# Patient Record
Sex: Female | Born: 1955 | Race: White | Hispanic: No | Marital: Single
Health system: Southern US, Community
[De-identification: ages and names within clinical notes are randomized; demographics above are authoritative.]

---

## 2005-05-09 ENCOUNTER — Ambulatory Visit: Payer: Self-pay | Admitting: *Deleted

## 2006-11-28 ENCOUNTER — Emergency Department: Payer: Self-pay | Admitting: Unknown Physician Specialty

## 2007-08-13 ENCOUNTER — Ambulatory Visit: Payer: Self-pay | Admitting: Family Medicine

## 2009-11-15 ENCOUNTER — Ambulatory Visit: Payer: Self-pay | Admitting: Family Medicine

## 2010-05-02 ENCOUNTER — Ambulatory Visit: Payer: Self-pay | Admitting: Gastroenterology

## 2011-02-21 ENCOUNTER — Ambulatory Visit: Payer: Self-pay | Admitting: Family Medicine

## 2012-05-13 ENCOUNTER — Ambulatory Visit: Payer: Self-pay | Admitting: Family Medicine

## 2012-06-29 ENCOUNTER — Ambulatory Visit: Payer: Self-pay | Admitting: Family Medicine

## 2012-06-29 LAB — URINALYSIS, COMPLETE
Bilirubin,UR: NEGATIVE
Glucose,UR: NEGATIVE mg/dL (ref 0–75)
Ph: 6 (ref 4.5–8.0)

## 2012-07-01 LAB — URINE CULTURE

## 2013-06-29 ENCOUNTER — Ambulatory Visit: Payer: Self-pay | Admitting: Nurse Practitioner

## 2019-04-01 ENCOUNTER — Other Ambulatory Visit: Payer: Self-pay | Admitting: Family Medicine

## 2019-04-01 DIAGNOSIS — Z1231 Encounter for screening mammogram for malignant neoplasm of breast: Secondary | ICD-10-CM

## 2019-06-22 ENCOUNTER — Ambulatory Visit
Admission: RE | Admit: 2019-06-22 | Discharge: 2019-06-22 | Disposition: A | Discharge status: Home / Self Care | Payer: 59 | Source: Ambulatory Visit | Attending: Family Medicine | Admitting: Family Medicine

## 2019-06-22 ENCOUNTER — Other Ambulatory Visit: Payer: Self-pay

## 2019-06-22 DIAGNOSIS — Z1231 Encounter for screening mammogram for malignant neoplasm of breast: Secondary | ICD-10-CM | POA: Diagnosis not present

## 2020-08-08 ENCOUNTER — Encounter: Payer: Self-pay | Admitting: *Deleted

## 2020-08-10 ENCOUNTER — Other Ambulatory Visit: Payer: Self-pay | Admitting: Family Medicine

## 2020-08-10 DIAGNOSIS — Z1231 Encounter for screening mammogram for malignant neoplasm of breast: Secondary | ICD-10-CM

## 2020-08-29 ENCOUNTER — Other Ambulatory Visit: Payer: Self-pay

## 2020-08-29 ENCOUNTER — Ambulatory Visit
Admission: RE | Admit: 2020-08-29 | Discharge: 2020-08-29 | Disposition: A | Discharge status: Home / Self Care | Payer: PRIVATE HEALTH INSURANCE | Source: Ambulatory Visit | Attending: Family Medicine | Admitting: Family Medicine

## 2020-08-29 DIAGNOSIS — Z1231 Encounter for screening mammogram for malignant neoplasm of breast: Secondary | ICD-10-CM | POA: Insufficient documentation

## 2021-03-27 DIAGNOSIS — H35342 Macular cyst, hole, or pseudohole, left eye: Secondary | ICD-10-CM | POA: Diagnosis not present

## 2021-04-28 DIAGNOSIS — H43811 Vitreous degeneration, right eye: Secondary | ICD-10-CM | POA: Diagnosis not present

## 2021-04-28 DIAGNOSIS — H2513 Age-related nuclear cataract, bilateral: Secondary | ICD-10-CM | POA: Diagnosis not present

## 2021-04-28 DIAGNOSIS — H35342 Macular cyst, hole, or pseudohole, left eye: Secondary | ICD-10-CM | POA: Diagnosis not present

## 2021-05-18 DIAGNOSIS — H35342 Macular cyst, hole, or pseudohole, left eye: Secondary | ICD-10-CM | POA: Diagnosis not present

## 2021-05-19 DIAGNOSIS — H35342 Macular cyst, hole, or pseudohole, left eye: Secondary | ICD-10-CM | POA: Diagnosis not present

## 2021-05-26 DIAGNOSIS — H35342 Macular cyst, hole, or pseudohole, left eye: Secondary | ICD-10-CM | POA: Diagnosis not present

## 2021-06-16 DIAGNOSIS — H43811 Vitreous degeneration, right eye: Secondary | ICD-10-CM | POA: Diagnosis not present

## 2021-06-16 DIAGNOSIS — H35342 Macular cyst, hole, or pseudohole, left eye: Secondary | ICD-10-CM | POA: Diagnosis not present

## 2021-06-16 DIAGNOSIS — H35371 Puckering of macula, right eye: Secondary | ICD-10-CM | POA: Diagnosis not present

## 2021-10-10 ENCOUNTER — Other Ambulatory Visit: Payer: Self-pay | Admitting: Family Medicine

## 2021-10-10 DIAGNOSIS — Z1231 Encounter for screening mammogram for malignant neoplasm of breast: Secondary | ICD-10-CM

## 2021-12-06 ENCOUNTER — Ambulatory Visit
Admission: RE | Admit: 2021-12-06 | Discharge: 2021-12-06 | Disposition: A | Discharge status: Home / Self Care | Payer: PPO | Source: Ambulatory Visit | Attending: Family Medicine | Admitting: Family Medicine

## 2021-12-06 ENCOUNTER — Other Ambulatory Visit: Payer: Self-pay

## 2021-12-06 DIAGNOSIS — Z1231 Encounter for screening mammogram for malignant neoplasm of breast: Secondary | ICD-10-CM | POA: Diagnosis not present

## 2022-08-21 ENCOUNTER — Ambulatory Visit (LOCAL_COMMUNITY_HEALTH_CENTER): Payer: Self-pay

## 2022-08-21 DIAGNOSIS — Z111 Encounter for screening for respiratory tuberculosis: Secondary | ICD-10-CM

## 2022-08-21 NOTE — Progress Notes (Signed)
Here for ppd. Pt explains plans to have PPDR at employer [facility]) and not at [place]. Copy of PPD placement info given to pt. ROI signed. Josie Saunders, RN

## 2022-08-24 ENCOUNTER — Ambulatory Visit (LOCAL_COMMUNITY_HEALTH_CENTER): Payer: Self-pay

## 2022-08-24 DIAGNOSIS — Z111 Encounter for screening for respiratory tuberculosis: Secondary | ICD-10-CM

## 2022-08-24 LAB — TB SKIN TEST
Induration: 0 mm
TB Skin Test: NEGATIVE

## 2022-12-24 DIAGNOSIS — E039 Hypothyroidism, unspecified: Secondary | ICD-10-CM | POA: Diagnosis not present

## 2022-12-24 DIAGNOSIS — Z0000 Encounter for general adult medical examination without abnormal findings: Secondary | ICD-10-CM | POA: Diagnosis not present

## 2022-12-24 DIAGNOSIS — I10 Essential (primary) hypertension: Secondary | ICD-10-CM | POA: Diagnosis not present

## 2023-01-01 DIAGNOSIS — Z1283 Encounter for screening for malignant neoplasm of skin: Secondary | ICD-10-CM | POA: Diagnosis not present

## 2023-01-01 DIAGNOSIS — Z1211 Encounter for screening for malignant neoplasm of colon: Secondary | ICD-10-CM | POA: Diagnosis not present

## 2023-01-01 DIAGNOSIS — E039 Hypothyroidism, unspecified: Secondary | ICD-10-CM | POA: Diagnosis not present

## 2023-01-01 DIAGNOSIS — Z0000 Encounter for general adult medical examination without abnormal findings: Secondary | ICD-10-CM | POA: Diagnosis not present

## 2023-01-01 DIAGNOSIS — Z23 Encounter for immunization: Secondary | ICD-10-CM | POA: Diagnosis not present

## 2023-01-01 DIAGNOSIS — Z1231 Encounter for screening mammogram for malignant neoplasm of breast: Secondary | ICD-10-CM | POA: Diagnosis not present

## 2023-01-01 DIAGNOSIS — I10 Essential (primary) hypertension: Secondary | ICD-10-CM | POA: Diagnosis not present

## 2023-02-11 DIAGNOSIS — H2512 Age-related nuclear cataract, left eye: Secondary | ICD-10-CM | POA: Diagnosis not present

## 2023-02-11 DIAGNOSIS — Z961 Presence of intraocular lens: Secondary | ICD-10-CM | POA: Diagnosis not present

## 2023-02-12 DIAGNOSIS — H2511 Age-related nuclear cataract, right eye: Secondary | ICD-10-CM | POA: Diagnosis not present

## 2023-03-04 DIAGNOSIS — Z961 Presence of intraocular lens: Secondary | ICD-10-CM | POA: Diagnosis not present

## 2023-03-04 DIAGNOSIS — H2511 Age-related nuclear cataract, right eye: Secondary | ICD-10-CM | POA: Diagnosis not present

## 2023-05-14 DIAGNOSIS — L821 Other seborrheic keratosis: Secondary | ICD-10-CM | POA: Diagnosis not present

## 2023-05-14 DIAGNOSIS — D225 Melanocytic nevi of trunk: Secondary | ICD-10-CM | POA: Diagnosis not present

## 2023-05-28 ENCOUNTER — Ambulatory Visit: Payer: PPO

## 2023-05-28 DIAGNOSIS — Z83719 Family history of colon polyps, unspecified: Secondary | ICD-10-CM | POA: Diagnosis not present

## 2023-05-28 DIAGNOSIS — K5730 Diverticulosis of large intestine without perforation or abscess without bleeding: Secondary | ICD-10-CM | POA: Diagnosis not present

## 2023-05-28 DIAGNOSIS — K640 First degree hemorrhoids: Secondary | ICD-10-CM | POA: Diagnosis not present

## 2023-05-28 DIAGNOSIS — Z1211 Encounter for screening for malignant neoplasm of colon: Secondary | ICD-10-CM | POA: Diagnosis not present

## 2023-08-24 IMAGING — MG MM DIGITAL SCREENING BILAT W/ TOMO AND CAD
6 of 10 series · 6 of 30 positions shown · non-contrast
Comparison: Previous exam(s).

CLINICAL DATA: Screening.

EXAM:
DIGITAL SCREENING BILATERAL MAMMOGRAM WITH TOMOSYNTHESIS AND CAD
TECHNIQUE: Bilateral screening digital craniocaudal and mediolateral oblique
mammograms were obtained. Bilateral screening digital breast
tomosynthesis was performed. The images were evaluated with
computer-aided detection.

[L MLO synth-2D]
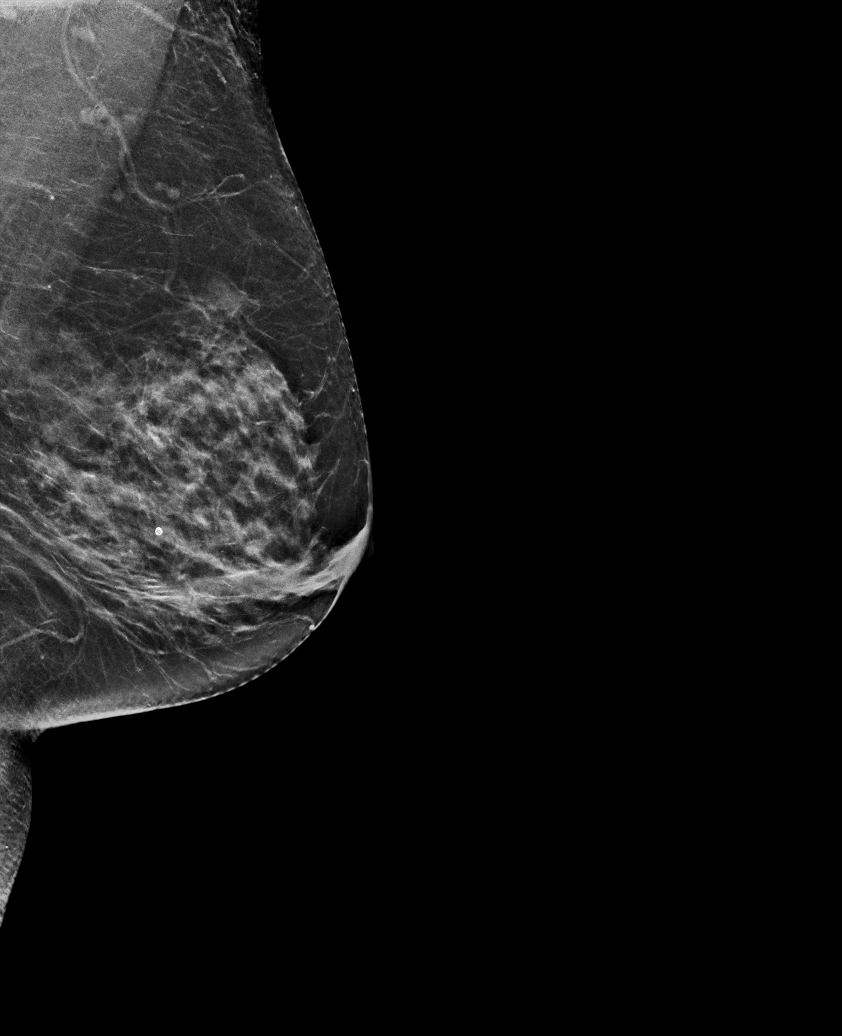

[R CC synth-2D (1 of 2)]
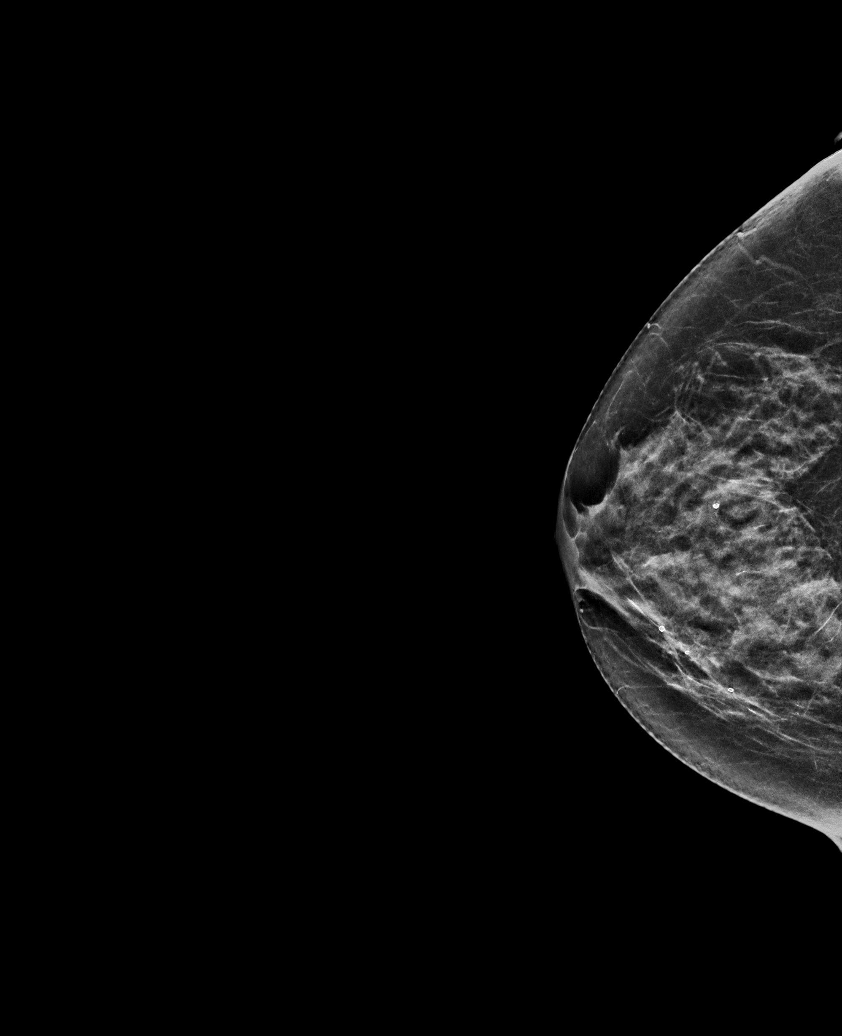

[R MLO synth-2D]
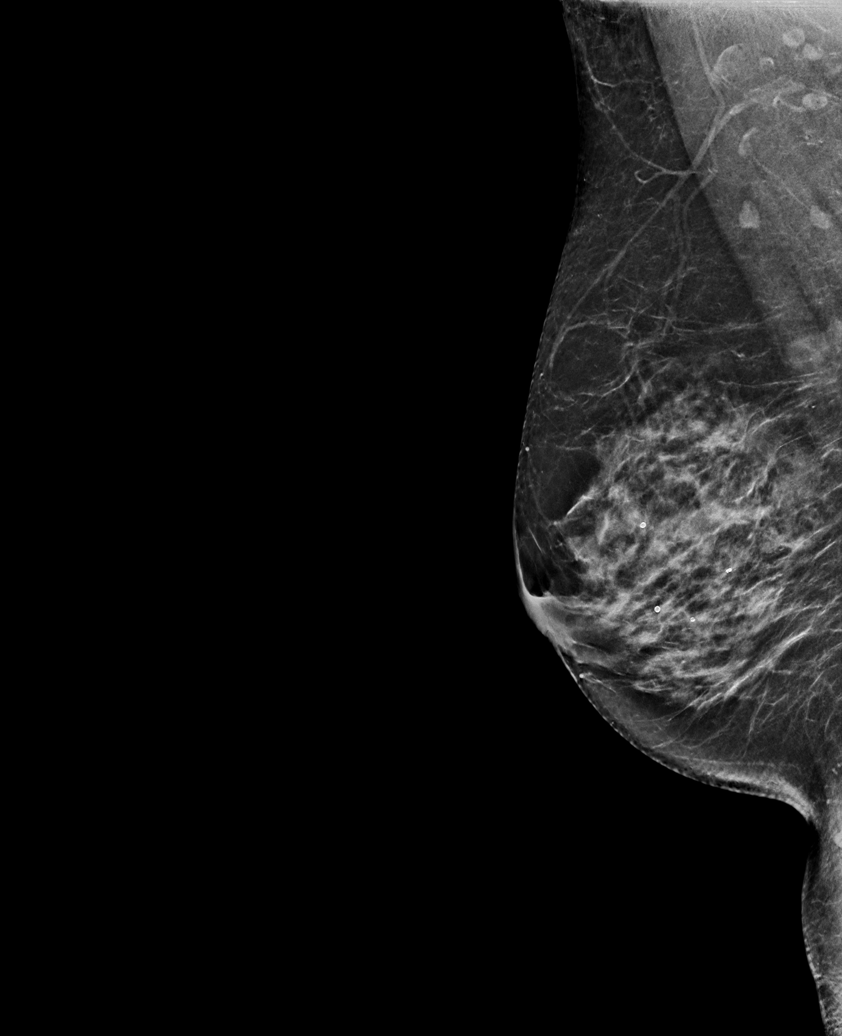

[R CC synth-2D (2 of 2)]
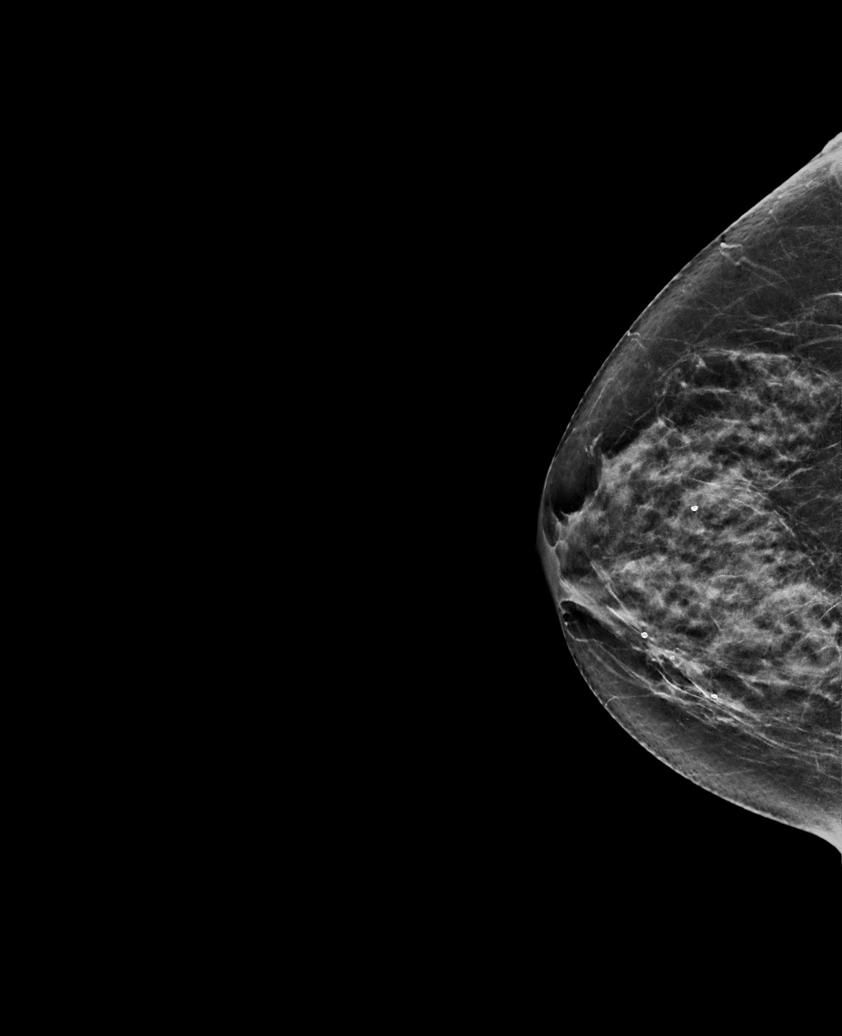

[L CC synth-2D]
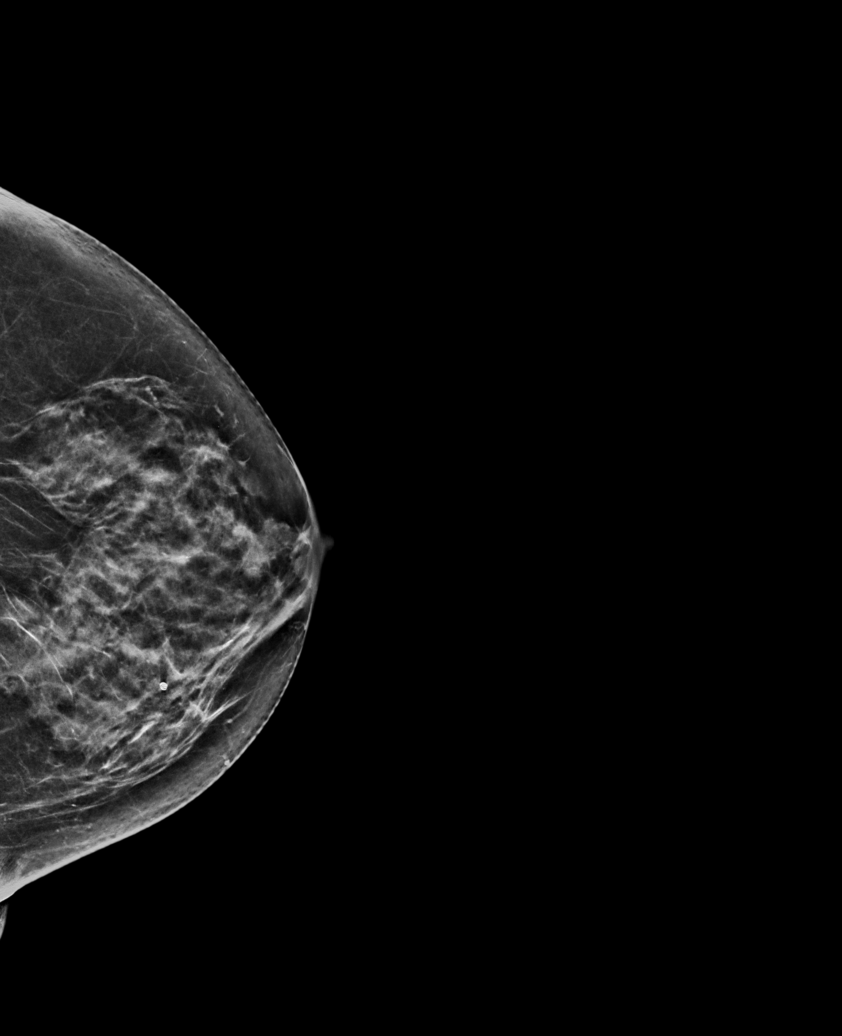

[L CC tomo · tomo slice 31/60.0]
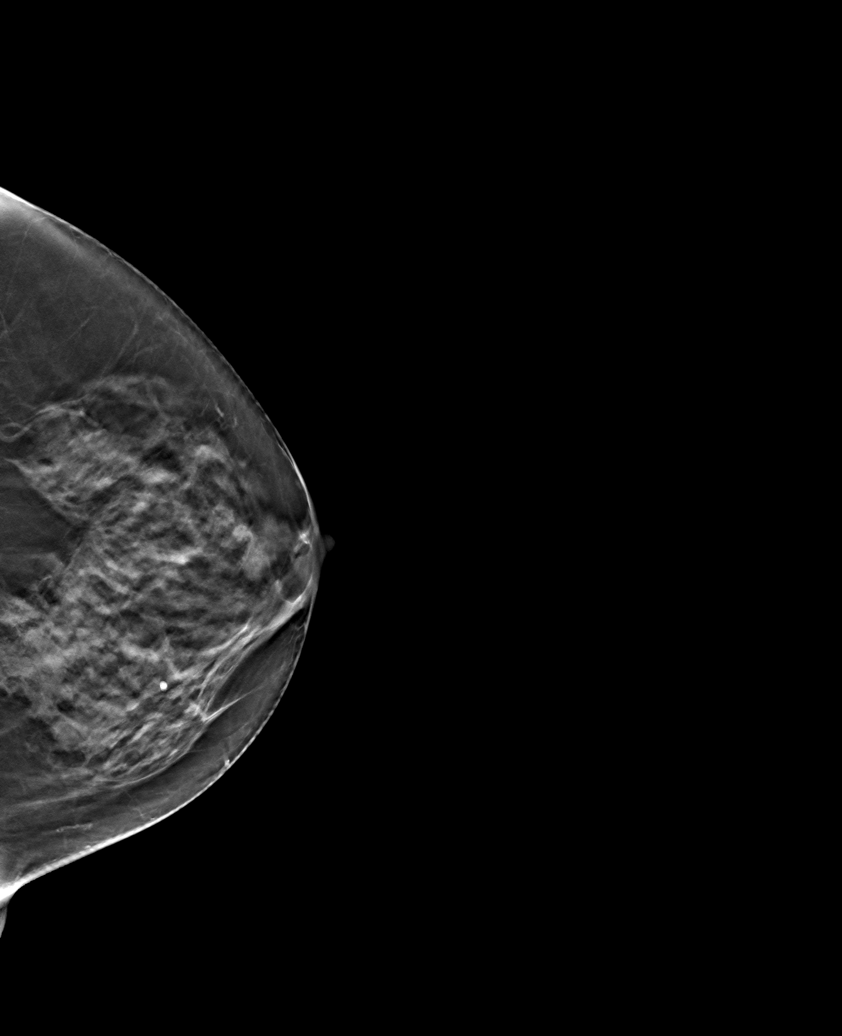

[6 of 30 positions shown; findings below may reference images not displayed]

ACR Breast Density Category c: The breast tissue is heterogeneously
dense, which may obscure small masses.
FINDINGS: There are no findings suspicious for malignancy.
IMPRESSION: No mammographic evidence of malignancy. A result letter of this
screening mammogram will be mailed directly to the patient.

RECOMMENDATION:
Screening mammogram in one year. (Code:Q3-W-BC3)

BI-RADS CATEGORY  1: Negative.

## 2024-01-13 DIAGNOSIS — Z1231 Encounter for screening mammogram for malignant neoplasm of breast: Secondary | ICD-10-CM | POA: Diagnosis not present

## 2024-01-13 DIAGNOSIS — E039 Hypothyroidism, unspecified: Secondary | ICD-10-CM | POA: Diagnosis not present

## 2024-01-13 DIAGNOSIS — I10 Essential (primary) hypertension: Secondary | ICD-10-CM | POA: Diagnosis not present

## 2024-01-13 DIAGNOSIS — Z0000 Encounter for general adult medical examination without abnormal findings: Secondary | ICD-10-CM | POA: Diagnosis not present

## 2024-01-13 DIAGNOSIS — R5383 Other fatigue: Secondary | ICD-10-CM | POA: Diagnosis not present

## 2024-01-13 DIAGNOSIS — E611 Iron deficiency: Secondary | ICD-10-CM | POA: Diagnosis not present

## 2024-01-13 DIAGNOSIS — M8580 Other specified disorders of bone density and structure, unspecified site: Secondary | ICD-10-CM | POA: Diagnosis not present

## 2024-01-14 ENCOUNTER — Other Ambulatory Visit: Payer: Self-pay | Admitting: Family Medicine

## 2024-01-14 DIAGNOSIS — Z1231 Encounter for screening mammogram for malignant neoplasm of breast: Secondary | ICD-10-CM

## 2024-01-16 DIAGNOSIS — H26493 Other secondary cataract, bilateral: Secondary | ICD-10-CM | POA: Diagnosis not present

## 2024-03-12 DIAGNOSIS — M810 Age-related osteoporosis without current pathological fracture: Secondary | ICD-10-CM | POA: Diagnosis not present

## 2024-04-02 DIAGNOSIS — H26491 Other secondary cataract, right eye: Secondary | ICD-10-CM | POA: Diagnosis not present

## 2024-04-02 DIAGNOSIS — Z961 Presence of intraocular lens: Secondary | ICD-10-CM | POA: Diagnosis not present

## 2024-04-02 DIAGNOSIS — H02834 Dermatochalasis of left upper eyelid: Secondary | ICD-10-CM | POA: Diagnosis not present

## 2024-04-02 DIAGNOSIS — H18413 Arcus senilis, bilateral: Secondary | ICD-10-CM | POA: Diagnosis not present

## 2024-04-02 DIAGNOSIS — H26493 Other secondary cataract, bilateral: Secondary | ICD-10-CM | POA: Diagnosis not present

## 2024-05-12 DIAGNOSIS — Z961 Presence of intraocular lens: Secondary | ICD-10-CM | POA: Diagnosis not present

## 2024-05-12 DIAGNOSIS — H26492 Other secondary cataract, left eye: Secondary | ICD-10-CM | POA: Diagnosis not present

## 2024-09-25 NOTE — Progress Notes (Signed)
 Erica Ewing                                          MRN: [id number]   09/25/2024   The [facility] Quality Team Specialist reviewed this patient medical record for the purposes of chart review for care gap closure. The following were reviewed: chart review for care gap closure-controlling blood pressure.    [facility] Quality Team
